# Patient Record
Sex: Male | Born: 2000 | Race: Black or African American | Hispanic: No | Marital: Single | Smoking: Never smoker
Health system: Southern US, Community
[De-identification: ages and names within clinical notes are randomized; demographics above are authoritative.]

---
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
## 2018-04-18 ENCOUNTER — Other Ambulatory Visit: Payer: Self-pay

## 2018-04-18 ENCOUNTER — Emergency Department (HOSPITAL_COMMUNITY)
Admission: EM | Admit: 2018-04-18 | Discharge: 2018-04-19 | Disposition: A | Discharge status: Home / Self Care | Payer: Self-pay | Attending: Emergency Medicine | Admitting: Emergency Medicine

## 2018-04-18 ENCOUNTER — Encounter (HOSPITAL_COMMUNITY): Payer: Self-pay | Admitting: Emergency Medicine

## 2018-04-18 DIAGNOSIS — Z79899 Other long term (current) drug therapy: Secondary | ICD-10-CM | POA: Insufficient documentation

## 2018-04-18 DIAGNOSIS — J4521 Mild intermittent asthma with (acute) exacerbation: Secondary | ICD-10-CM | POA: Insufficient documentation

## 2018-04-18 MED ORDER — ALBUTEROL SULFATE (2.5 MG/3ML) 0.083% IN NEBU
5.0000 mg | INHALATION_SOLUTION | Freq: Once | RESPIRATORY_TRACT | Status: AC
  Administered 2018-04-18: 5 mg via RESPIRATORY_TRACT
  Filled 2018-04-18: qty 6

## 2018-04-18 NOTE — ED Triage Notes (Signed)
C/o asthma flare-up since noon today.  Out of inhalers.

## 2018-04-19 ENCOUNTER — Emergency Department (HOSPITAL_COMMUNITY): Payer: Self-pay

## 2018-04-19 MED ORDER — ALBUTEROL SULFATE HFA 108 (90 BASE) MCG/ACT IN AERS: 1.0000 | INHALATION_SPRAY | Freq: Four times a day (QID) | RESPIRATORY_TRACT | 0 refills | Status: AC | PRN

## 2018-04-19 MED ORDER — ALBUTEROL SULFATE HFA 108 (90 BASE) MCG/ACT IN AERS
2.0000 | INHALATION_SPRAY | Freq: Once | RESPIRATORY_TRACT | Status: DC
  Filled 2018-04-19: qty 6.7

## 2018-04-19 NOTE — ED Provider Notes (Signed)
[facility] EMERGENCY [facility]PARTMENT Provider Note   CSN: [id number] Arrival date & time: 04/18/18  2330     History   Chief Complaint Chief Complaint  Patient presents with  . Asthma    HPI Randy Everett is a 17 y.o. male.  The history is provided by the patient and medical records.  Asthma  Associated symptoms include shortness of breath.    17 y.o. M with hx of asthma here with acute exacerbation.  Works in a [employer] around a lot of dust.  States he has been out of his inhaler for a few weeks.  No recent illness, fever, chills, sweats, chest pain.   VSS.  Past Medical History:  Diagnosis Date  . Asthma     There are no active problems to display for this patient.   History reviewed. No pertinent surgical history.      Home Medications    Prior to Admission medications   Medication Sig Start Date End Date Taking? Authorizing Provider  albuterol (PROVENTIL HFA;VENTOLIN HFA) 108 (90 Base) MCG/ACT inhaler Inhale 1-2 puffs into the lungs every 6 (six) hours as needed for wheezing. 04/19/18   Garlon HatchetSanders, Maisen Schmit M, PA-C  predniSONE (DELTASONE) 20 MG tablet 2 tabs po daily x 4 days 12/30/17   Felicie MornSmith, David, NP    Family History No family history on file.  Social History Social History   Tobacco Use  . Smoking status: Never Smoker  . Smokeless tobacco: Never Used  Substance Use Topics  . Alcohol use: No  . Drug use: No     Allergies   Patient has no known allergies.   Review of Systems Review of Systems  Respiratory: Positive for shortness of breath and wheezing.   All other systems reviewed and are negative.    Physical Exam Updated Vital Signs BP 129/82 (BP Location: Right Arm)   Pulse 86   Temp 97.8 F (36.6 C) (Oral)   Resp 20   SpO2 98%   Physical Exam  Constitutional: He is oriented to person, place, and time. He appears well-developed and well-nourished.  HENT:  Head: Normocephalic and atraumatic.  Mouth/Throat: Oropharynx  is clear and moist.  Eyes: Pupils are equal, round, and reactive to light. Conjunctivae and EOM are normal.  Neck: Normal range of motion.  Cardiovascular: Normal rate, regular rhythm and normal heart sounds.  Pulmonary/Chest: Effort normal. He has no decreased breath sounds. He has wheezes. He has no rhonchi.  Very faint expiratory wheeze, NAD, speaking in full sentences without difficulty  Abdominal: Soft. Bowel sounds are normal.  Musculoskeletal: Normal range of motion.  Neurological: He is alert and oriented to person, place, and time.  Skin: Skin is warm and dry.  Psychiatric: He has a normal mood and affect.  Nursing note and vitals reviewed.    ED Treatments / Results  Labs (all labs ordered are listed, but only abnormal results are displayed) Labs Reviewed - No data to display  EKG None  Radiology Dg Chest 2 View  Result Date: 04/19/2018 CLINICAL DATA:  17 year old male with shortness of breath and asthma. EXAM: CHEST - 2 VIEW COMPARISON:  Chest radiograph dated 12/30/2017 FINDINGS: There is hyperexpansion of the lungs in keeping with known asthma. No focal consolidation, pleural effusion, or pneumothorax. The cardiac silhouette is within normal limits. There is thoracic dextroscoliosis. No acute osseous pathology. IMPRESSION: No active cardiopulmonary disease. Electronically Signed   By: Elgie CollardArash  Radparvar M.D.   On: 04/19/2018 00:20  Procedures Procedures (including critical care time)  Medications Ordered in ED Medications  albuterol (PROVENTIL HFA;VENTOLIN HFA) 108 (90 Base) MCG/ACT inhaler 2 puff (has no administration in time range)  albuterol (PROVENTIL) (2.5 MG/3ML) 0.083% nebulizer solution 5 mg (5 mg Nebulization Given 04/18/18 2341)     Initial Impression / Assessment and Plan / ED Course  I have reviewed the triage vital signs and the nursing notes.  Pertinent labs & imaging results that were available during my care of the patient were reviewed by me and  considered in my medical decision making (see chart for details).  17 y.o. M with history of asthma, here with mild exacerbation.  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 Has been out of inhalers for several weeks.  Given neb treatment here which resolved symptoms.  Lungs mostly clear on my exam, NAD.  VSS.  Given inhaler here as well as prescription for refill.  Can follow-up with PCP.  Discussed plan with patient, he acknowledged understanding and agreed with plan of care.  Return precautions given for new or worsening symptoms.  Final Clinical Impressions(s) / ED Diagnoses   Final diagnoses:  Mild intermittent asthma with exacerbation    ED Discharge Orders         Ordered    albuterol (PROVENTIL HFA;VENTOLIN HFA) 108 (90 Base) MCG/ACT inhaler  Every 6 hours PRN     04/19/18 0033           Garlon Hatchet, PA-C 04/19/18 1610    Geoffery Lyons, MD 04/19/18 [phone]

## 2018-04-19 NOTE — ED Notes (Signed)
Patient demonstrated appropriate use of albuterol inhaler. Patient verbalizes understanding of discharge instructions. Opportunity for questioning and answers were provided. Armband removed by staff, pt discharged from ED, ambulatory to the lobby.

## 2018-04-19 NOTE — Discharge Instructions (Signed)
Use inhaler 2 puffs every 4-6 hours as needed for asthma attack. Follow-up with your primary care doctor. Return to the ED for new or worsening symptoms.

## 2018-05-20 ENCOUNTER — Encounter (HOSPITAL_COMMUNITY): Payer: Self-pay | Admitting: Emergency Medicine

## 2018-05-20 ENCOUNTER — Emergency Department (HOSPITAL_COMMUNITY)
Admission: EM | Admit: 2018-05-20 | Discharge: 2018-05-20 | Disposition: A | Discharge status: Home / Self Care | Payer: Managed Care, Other (non HMO) | Attending: Emergency Medicine | Admitting: Emergency Medicine

## 2018-05-20 ENCOUNTER — Other Ambulatory Visit: Payer: Self-pay

## 2018-05-20 DIAGNOSIS — Y929 Unspecified place or not applicable: Secondary | ICD-10-CM | POA: Diagnosis not present

## 2018-05-20 DIAGNOSIS — W268XXA Contact with other sharp object(s), not elsewhere classified, initial encounter: Secondary | ICD-10-CM | POA: Diagnosis not present

## 2018-05-20 DIAGNOSIS — Y939 Activity, unspecified: Secondary | ICD-10-CM | POA: Insufficient documentation

## 2018-05-20 DIAGNOSIS — Z79899 Other long term (current) drug therapy: Secondary | ICD-10-CM | POA: Insufficient documentation

## 2018-05-20 DIAGNOSIS — Z23 Encounter for immunization: Secondary | ICD-10-CM | POA: Diagnosis not present

## 2018-05-20 DIAGNOSIS — J45909 Unspecified asthma, uncomplicated: Secondary | ICD-10-CM | POA: Diagnosis not present

## 2018-05-20 DIAGNOSIS — Y999 Unspecified external cause status: Secondary | ICD-10-CM | POA: Insufficient documentation

## 2018-05-20 DIAGNOSIS — S51812A Laceration without foreign body of left forearm, initial encounter: Secondary | ICD-10-CM | POA: Diagnosis present

## 2018-05-20 DIAGNOSIS — S41112A Laceration without foreign body of left upper arm, initial encounter: Secondary | ICD-10-CM

## 2018-05-20 MED ORDER — LIDOCAINE HCL 2 % IJ SOLN
20.0000 mL | Freq: Once | INTRAMUSCULAR | Status: AC
  Administered 2018-05-20: 400 mg
  Filled 2018-05-20: qty 20

## 2018-05-20 MED ORDER — TETANUS-DIPHTH-ACELL PERTUSSIS 5-2.5-18.5 LF-MCG/0.5 IM SUSP
0.5000 mL | Freq: Once | INTRAMUSCULAR | Status: AC
  Administered 2018-05-20: 0.5 mL via INTRAMUSCULAR
  Filled 2018-05-20: qty 0.5

## 2018-05-20 NOTE — Discharge Instructions (Addendum)
Please read attached information, please return in 7 to 10 days for suture removal, return sooner if any new or worsening signs or symptoms present occluding redness, swelling, warmth or discharge.

## 2018-05-20 NOTE — ED Triage Notes (Signed)
Patient presents with laceration to anterior left forearm. States he cut it on a metal trashcan. Needs tetanus shot. Bleeding controlled.

## 2018-05-20 NOTE — ED Notes (Signed)
Patient verbalizes understanding of discharge instructions. Opportunity for questioning and answers were provided. Armband removed by staff, pt discharged from ED ambulatory.   

## 2018-05-20 NOTE — ED Notes (Signed)
ED Provider at bedside. 

## 2018-05-20 NOTE — ED Provider Notes (Signed)
[facility] EMERGENCY DEPARTMENT Provider Note   CSN: [id number] Arrival date & time: 05/20/18  1105    History   Chief Complaint Chief Complaint  Patient presents with  . Laceration    HPI Randy Everett is a 17 y.o. male.  HPI   17 year old male presents today with laceration to his left forearm.  Patient notes shortly prior to arrival to the emergency room he cut his forearm on a trash can.  He notes bleeding controlled, denies any loss of distal sensation strength and motor function.  Uncertain of his last tetanus shot.   Past Medical History:  Diagnosis Date  . Asthma     There are no active problems to display for this patient.   History reviewed. No pertinent surgical history.      Home Medications    Prior to Admission medications   Medication Sig Start Date End Date Taking? Authorizing Provider  albuterol (PROVENTIL HFA;VENTOLIN HFA) 108 (90 Base) MCG/ACT inhaler Inhale 1-2 puffs into the lungs every 6 (six) hours as needed for wheezing. 04/19/18   Garlon Hatchet, PA-C  predniSONE (DELTASONE) 20 MG tablet 2 tabs po daily x 4 days 12/30/17   Felicie Morn, NP    Family History No family history on file.  Social History Social History   Tobacco Use  . Smoking status: Never Smoker  . Smokeless tobacco: Never Used  Substance Use Topics  . Alcohol use: No  . Drug use: No     Allergies   Patient has no known allergies.   Review of Systems Review of Systems  All other systems reviewed and are negative.   Physical Exam Updated Vital Signs BP 116/75   Pulse 65   Temp 98.6 F (37 C) (Oral)   Resp 14   Ht 6' (1.829 m)   Wt 68 kg   SpO2 100%   BMI 20.34 kg/m   Physical Exam  Constitutional: He is oriented to person, place, and time. He appears well-developed and well-nourished.  HENT:  Head: Normocephalic and atraumatic.  Eyes: Pupils are equal, round, and reactive to light. Conjunctivae are normal. Right eye exhibits no  discharge. Left eye exhibits no discharge. No scleral icterus.  Neck: Normal range of motion. No JVD present. No tracheal deviation present.  Pulmonary/Chest: Effort normal. No stridor.  Musculoskeletal:  4 cm laceration to left forearm- no deep space involvement-distal sensation strength motor function intact  Neurological: He is alert and oriented to person, place, and time. Coordination normal.  Psychiatric: He has a normal mood and affect. His behavior is normal. Judgment and thought content normal.  Nursing note and vitals reviewed.    ED Treatments / Results  Labs (all labs ordered are listed, but only abnormal results are displayed) Labs Reviewed - No data to display  EKG None  Radiology No results found.  Procedures .Marland KitchenLaceration Repair Date/Time: 05/20/2018 2:59 PM Performed by: Eyvonne Mechanic, PA-C Authorized by: Eyvonne Mechanic, PA-C   Consent:    Consent obtained:  Verbal   Consent given by:  Patient   Risks discussed:  Infection, need for additional repair, nerve damage, pain, poor cosmetic result, poor wound healing, tendon damage and vascular damage   Alternatives discussed:  No treatment and delayed treatment Anesthesia (see MAR for exact dosages):    Anesthesia method:  Local infiltration   Local anesthetic:  Lidocaine 2% w/o epi Laceration details:    Location: left forearm    Length (cm):  4 Repair type:  Repair type:  Simple Pre-procedure details:    Preparation:  Patient was prepped and draped in usual sterile fashion Exploration:    Wound exploration: wound explored through full range of motion and entire depth of wound probed and visualized     Wound extent: no fascia violation noted, no foreign bodies/material noted, no muscle damage noted, no nerve damage noted, no tendon damage noted, no underlying fracture noted and no vascular damage noted     Contaminated: no   Treatment:    Area cleansed with:  Saline   Amount of cleaning:  Standard    Irrigation solution:  Sterile saline   Visualized foreign bodies/material removed: no   Skin repair:    Repair method:  Sutures   Suture size:  3-0   Suture material:  Prolene   Number of sutures:  7 Approximation:    Approximation:  Close Post-procedure details:    Dressing:  Antibiotic ointment and non-adherent dressing   Patient tolerance of procedure:  Tolerated well, no immediate complications Comments:     Suture repaired with the assistance of PA student Bebe Liter   (including critical care time)  Medications Ordered in ED Medications  lidocaine (XYLOCAINE) 2 % (with pres) injection 400 mg (400 mg Infiltration Given by Other 05/20/18 1356)  Tdap (BOOSTRIX) injection 0.5 mL (0.5 mLs Intramuscular Given 05/20/18 1224)     Initial Impression / Assessment and Plan / ED Course  I have reviewed the triage vital signs and the nursing notes.  Pertinent labs & imaging results that were available during my care of the patient were reviewed by me and considered in my medical decision making (see chart for details).     Labs:   Imaging:  Consults:  Therapeutics:  Discharge Meds:   Assessment/Plan: 17 year old male presents today with laceration to his left forearm, no deep space involvement, wound uncomplicated.  Return precautions given for suture removal or worsening signs or symptoms.  Patient verbalized understanding and agreement to today's plan.  Final Clinical Impressions(s) / ED Diagnoses   Final diagnoses:  Laceration of left upper extremity, initial encounter    ED Discharge Orders    None       Eyvonne Mechanic, PA-C 05/20/18 1501    Virgina Norfolk, DO 05/20/18 2336

## 2018-05-28 ENCOUNTER — Emergency Department (HOSPITAL_COMMUNITY)
Admission: EM | Admit: 2018-05-28 | Discharge: 2018-05-28 | Disposition: A | Discharge status: Home / Self Care | Payer: Managed Care, Other (non HMO) | Attending: Emergency Medicine | Admitting: Emergency Medicine

## 2018-05-28 ENCOUNTER — Encounter (HOSPITAL_COMMUNITY): Payer: Self-pay | Admitting: *Deleted

## 2018-05-28 DIAGNOSIS — J45909 Unspecified asthma, uncomplicated: Secondary | ICD-10-CM | POA: Diagnosis not present

## 2018-05-28 DIAGNOSIS — R222 Localized swelling, mass and lump, trunk: Secondary | ICD-10-CM | POA: Diagnosis present

## 2018-05-28 DIAGNOSIS — L0291 Cutaneous abscess, unspecified: Secondary | ICD-10-CM

## 2018-05-28 DIAGNOSIS — L0231 Cutaneous abscess of buttock: Secondary | ICD-10-CM | POA: Insufficient documentation

## 2018-05-28 MED ORDER — LIDOCAINE HCL 2 % IJ SOLN
20.0000 mL | Freq: Once | INTRAMUSCULAR | Status: AC
  Administered 2018-05-28: 400 mg
  Filled 2018-05-28: qty 20

## 2018-05-28 MED ORDER — SULFAMETHOXAZOLE-TRIMETHOPRIM 800-160 MG PO TABS: 2.0000 | ORAL_TABLET | Freq: Two times a day (BID) | ORAL | 0 refills | Status: AC

## 2018-05-28 NOTE — ED Notes (Signed)
Declined W/C at D/C and was escorted to lobby by RN. 

## 2018-05-28 NOTE — Discharge Instructions (Signed)
Return here in 2 days for packing removal.  Use warm compresses and heat around the area.  Keep the area around clean and dry.

## 2018-05-28 NOTE — ED Provider Notes (Signed)
[facility] EMERGENCY DEPARTMENT Provider Note   CSN: [id number] Arrival date & time: 05/28/18  4098     History   Chief Complaint Chief Complaint  Patient presents with  . Abscess    HPI Randy Everett is a 17 y.o. male.  HPI Patient presents to the emergency department with an abscess to the left lower buttocks upper thigh region serially.  The patient states that this is been ongoing over the last 4 days.  He states the pain increased last night.  He states there is no drainage from the area.  Patient states he has not taken any medications prior to arrival for symptoms.  Patient denies fever, nausea, vomiting, rectal bleeding, rectal pain, penile discharge, weakness, dizziness, headache, blurred vision or syncope. Past Medical History:  Diagnosis Date  . Asthma     There are no active problems to display for this patient.   History reviewed. No pertinent surgical history.      Home Medications    Prior to Admission medications   Medication Sig Start Date End Date Taking? Authorizing Provider  albuterol (PROVENTIL HFA;VENTOLIN HFA) 108 (90 Base) MCG/ACT inhaler Inhale 1-2 puffs into the lungs every 6 (six) hours as needed for wheezing. 04/19/18  Yes Garlon Hatchet, PA-C  predniSONE (DELTASONE) 20 MG tablet 2 tabs po daily x 4 days Patient not taking: Reported on 05/28/2018 12/30/17   Felicie Morn, NP    Family History History reviewed. No pertinent family history.  Social History Social History   Tobacco Use  . Smoking status: Never Smoker  . Smokeless tobacco: Never Used  Substance Use Topics  . Alcohol use: No  . Drug use: No     Allergies   Patient has no known allergies.   Review of Systems Review of Systems  All other systems negative except as documented in the HPI. All pertinent positives and negatives as reviewed in the HPI. Physical Exam Updated Vital Signs BP 124/68 (BP Location: Right Arm)   Pulse 81   Temp 98.2 F  (36.8 C) (Oral)   Resp 17   SpO2 98%   Physical Exam  Constitutional: He is oriented to person, place, and time. He appears well-developed and well-nourished. No distress.  HENT:  Head: Normocephalic and atraumatic.  Eyes: Pupils are equal, round, and reactive to light.  Pulmonary/Chest: Effort normal.  Neurological: He is alert and oriented to person, place, and time.  Skin: Skin is warm and dry.  Psychiatric: He has a normal mood and affect.  Nursing note and vitals reviewed.    ED Treatments / Results  Labs (all labs ordered are listed, but only abnormal results are displayed) Labs Reviewed - No data to display  EKG None  Radiology No results found.  Procedures Procedures (including critical care time)  Medications Ordered in ED Medications  lidocaine (XYLOCAINE) 2 % (with pres) injection 400 mg (400 mg Infiltration Given 05/28/18 0902)     Initial Impression / Assessment and Plan / ED Course  I have reviewed the triage vital signs and the nursing notes.  Pertinent labs & imaging results that were available during my care of the patient were reviewed by me and considered in my medical decision making (see chart for details).    INCISION AND DRAINAGE Performed by: Jamesetta Orleans Breindel Collier Consent: Verbal consent obtained. Risks and benefits: risks, benefits and alternatives were discussed Type: abscess  Body area: Lower buttocks upper posterior thigh   Anesthesia: local infiltration  Incision  was made with a scalpel.  Local anesthetic: lidocaine 2% wo epinephrine  Anesthetic total: 5 ml  Complexity: complex Blunt dissection to break up loculations  Drainage: purulent  Drainage amount: large  Packing material: 1/4 in iodoform gauze  Patient tolerance: Patient tolerated the procedure well with no immediate complications.  I advised the patient to return here as needed.  Have advised him to return here in 2 days for recheck and packing removal.  The  patient agrees the plan and all questions were answered.  I did pack the wound because the fact that it was in the perineal/anal/upper thigh region I was concerned for any further worsening.  Will place on antibiotics as well.   Final Clinical Impressions(s) / ED Diagnoses   Final diagnoses:  None    ED Discharge Orders    None       Charlestine Night, PA-C 05/28/18 1023    Arby Barrette, MD 05/29/18 [phone]

## 2018-05-28 NOTE — ED Triage Notes (Signed)
Pt in c/o abscess to his posterior left thigh x4 days, reports increased pain today, no drainage

## 2018-05-29 ENCOUNTER — Emergency Department (HOSPITAL_COMMUNITY)
Admission: EM | Admit: 2018-05-29 | Discharge: 2018-05-29 | Disposition: A | Discharge status: Home / Self Care | Payer: Managed Care, Other (non HMO) | Attending: Emergency Medicine | Admitting: Emergency Medicine

## 2018-05-29 ENCOUNTER — Encounter (HOSPITAL_COMMUNITY): Payer: Self-pay

## 2018-05-29 DIAGNOSIS — Z0289 Encounter for other administrative examinations: Secondary | ICD-10-CM

## 2018-05-29 DIAGNOSIS — Z0279 Encounter for issue of other medical certificate: Secondary | ICD-10-CM | POA: Insufficient documentation

## 2018-05-29 DIAGNOSIS — J45909 Unspecified asthma, uncomplicated: Secondary | ICD-10-CM | POA: Diagnosis not present

## 2018-05-29 DIAGNOSIS — Z79899 Other long term (current) drug therapy: Secondary | ICD-10-CM | POA: Insufficient documentation

## 2018-05-29 NOTE — ED Triage Notes (Signed)
Pt presents for work note. Pt was seen yesterday with abscess I&D to leg. States needs note with restrictions for work because job is very labor intensive.

## 2018-05-29 NOTE — ED Notes (Signed)
Pt reports that his abscess was drained here yesterday. Pt works in a [employer] where he is moving around. The abscess was drained in a very uncomfortable area that decreases mobility. States he needs a few days off of work to heal. Provider notified.

## 2018-05-29 NOTE — ED Notes (Signed)
ED Provider at bedside. 

## 2018-05-29 NOTE — ED Provider Notes (Signed)
[facility] EMERGENCY DEPARTMENT Provider Note   CSN: [id number] Arrival date & time: 05/29/18  1127     History   Chief Complaint Chief Complaint  Patient presents with  . Discharge Note    HPI Randy Everett is a 17 y.o. male with a hx of asthma who presents to the ED requesting work note. Patient was seen in the ED yesterday and was found to have a left sided perianal/upper thigh region abscess- underwent incision and drainage with packing placement and was discharged home on bactrim with instructions for wound recheck and packing removal in 2 days. Patient states he has been doing well since the procedure, the area is somewhat uncomfortable, and does continue to drain. No specific alleviating/aggravating factors. Taking abx as prescribed. Here today as he was not given a work note yesterday and does strenuous activity at his job which is very uncomfortable, no other major complaints. Denies fever, chills, or abdominal pain.   HPI  Past Medical History:  Diagnosis Date  . Asthma     There are no active problems to display for this patient.   History reviewed. No pertinent surgical history.      Home Medications    Prior to Admission medications   Medication Sig Start Date End Date Taking? Authorizing Provider  albuterol (PROVENTIL HFA;VENTOLIN HFA) 108 (90 Base) MCG/ACT inhaler Inhale 1-2 puffs into the lungs every 6 (six) hours as needed for wheezing. 04/19/18   Garlon Hatchet, PA-C  predniSONE (DELTASONE) 20 MG tablet 2 tabs po daily x 4 days Patient not taking: Reported on 05/28/2018 12/30/17   Felicie Morn, NP  sulfamethoxazole-trimethoprim (BACTRIM DS,SEPTRA DS) 800-160 MG tablet Take 2 tablets by mouth 2 (two) times daily for 7 days. 05/28/18 06/04/18  Charlestine Night, PA-C    Family History No family history on file.  Social History Social History   Tobacco Use  . Smoking status: Never Smoker  . Smokeless tobacco: Never Used  Substance  Use Topics  . Alcohol use: No  . Drug use: No     Allergies   Patient has no known allergies.   Review of Systems Review of Systems  Constitutional: Negative for chills and fever.  Gastrointestinal: Negative for abdominal pain, nausea and vomiting.  Skin: Positive for wound.     Physical Exam Updated Vital Signs BP 139/74 (BP Location: Right Arm)   Pulse 76   Temp 98.3 F (36.8 C) (Oral)   Resp 16   SpO2 98%   Physical Exam  Constitutional: He appears well-developed and well-nourished. No distress.  HENT:  Head: Normocephalic and atraumatic.  Eyes: Conjunctivae are normal. Right eye exhibits no discharge. Left eye exhibits no discharge.  Abdominal: Soft. He exhibits no distension. There is no tenderness.  Genitourinary:     Neurological: He is alert.  Clear speech.   Psychiatric: He has a normal mood and affect. His behavior is normal. Thought content normal.  Nursing note and vitals reviewed.    ED Treatments / Results  Labs (all labs ordered are listed, but only abnormal results are displayed) Labs Reviewed - No data to display  EKG None  Radiology No results found.  Procedures Procedures (including critical care time)  Medications Ordered in ED Medications - No data to display   Initial Impression / Assessment and Plan / ED Course  I have reviewed the triage vital signs and the nursing notes.  Pertinent labs & imaging results that were available during my care of  the patient were reviewed by me and considered in my medical decision making (see chart for details).   Patient returns to the emergency department requesting work note following incision and drainage with packing placement of a perianal/upper thigh abscess yesterday.  He is scheduled for wound recheck and packing removal tomorrow and is currently on Bactrim.  He states that he has been doing well since the procedure.  On exam packing in place, area appears to be doing well.  He states  that his job requires a lot of activity and movement which really aggravates the pain to the prior abscess area which is what prompted visit today-work note was provided.  Continue Bactrim as prescribed with plan for recheck and packing removal tomorrow. I discussed treatment plan, need for follow-up, and return precautions with the patient. Provided opportunity for questions, patient confirmed understanding and is in agreement with plan.   Final Clinical Impressions(s) / ED Diagnoses   Final diagnoses:  Encounter to obtain excuse from work    ED Discharge Orders    None       Cherly Anderson, [place] 05/29/18 1202    Arby Barrette, MD 05/29/18 [phone]

## 2018-05-29 NOTE — Discharge Instructions (Signed)
Work note was provided.  Please be sure to return to the ER, go to urgent care, or see your primary care provider tomorrow for a wound recheck and for removal of the packing.  Return to the ER sooner for new or worsening symptoms or any other concerns.

## 2019-06-07 ENCOUNTER — Emergency Department (HOSPITAL_COMMUNITY)
Admission: EM | Admit: 2019-06-07 | Discharge: 2019-06-07 | Disposition: A | Discharge status: Home / Self Care | Payer: Managed Care, Other (non HMO) | Attending: Emergency Medicine | Admitting: Emergency Medicine

## 2019-06-07 DIAGNOSIS — H1033 Unspecified acute conjunctivitis, bilateral: Secondary | ICD-10-CM

## 2019-06-07 DIAGNOSIS — Z79899 Other long term (current) drug therapy: Secondary | ICD-10-CM | POA: Insufficient documentation

## 2019-06-07 DIAGNOSIS — J45909 Unspecified asthma, uncomplicated: Secondary | ICD-10-CM | POA: Insufficient documentation

## 2019-06-07 MED ORDER — GENTAMICIN SULFATE 0.3 % OP SOLN: 2.0000 [drp] | OPHTHALMIC | 0 refills | Status: AC

## 2019-06-07 NOTE — Discharge Instructions (Addendum)
Apply antibiotic eyedrops every 4 hours as prescribed.  Continue to apply drops to both eyes until symptoms have completely resolved for 3 days. Use preservative-free eyedrops as needed. Take Zyrtec daily. If eye redness and itching persist, continue with Zyrtec and use Flonase.

## 2019-06-07 NOTE — ED Provider Notes (Signed)
[facility] EMERGENCY DEPARTMENT Provider Note   CSN: [id number] Arrival date & time: 06/07/19  4098     History   Chief Complaint No chief complaint on file.   HPI Randy Everett is a 18 y.o. male.     18 year old male presents with complaint of bilateral eye redness and itching for the past week.  Patient states his eyes are matted shut in the morning when he first wakes up otherwise no drainage throughout the day.  Patient has been applying OTC pinkeye drops without any improvement in his symptoms.  Denies cough, congestion, sneezing, runny nose, sore throat, body aches or changes in bowel or bladder habits.  No known contact with anyone with Covid symptoms.  No other complaints or concerns.  Randy Everett was evaluated in Emergency Department on 06/07/2019 for the symptoms described in the history of present illness. He was evaluated in the context of the global COVID-19 pandemic, which necessitated consideration that the patient might be at risk for infection with the SARS-CoV-2 virus that causes COVID-19. Institutional protocols and algorithms that pertain to the evaluation of patients at risk for COVID-19 are in a state of rapid change based on information released by regulatory bodies including the CDC and federal and state organizations. These policies and algorithms were followed during the patient's care in the ED.      Past Medical History:  Diagnosis Date  . Asthma     There are no active problems to display for this patient.   No past surgical history on file.      Home Medications    Prior to Admission medications   Medication Sig Start Date End Date Taking? Authorizing Provider  albuterol (PROVENTIL HFA;VENTOLIN HFA) 108 (90 Base) MCG/ACT inhaler Inhale 1-2 puffs into the lungs every 6 (six) hours as needed for wheezing. 04/19/18   Larene Pickett, PA-C  gentamicin (GARAMYCIN) 0.3 % ophthalmic solution Place 2 drops into both eyes every 4  (four) hours. 06/07/19   Tacy Learn, PA-C    Family History No family history on file.  Social History Social History   Tobacco Use  . Smoking status: Never Smoker  . Smokeless tobacco: Never Used  Substance Use Topics  . Alcohol use: No  . Drug use: No     Allergies   Patient has no known allergies.   Review of Systems Review of Systems  Constitutional: Negative for chills and fever.  HENT: Negative for congestion, postnasal drip, rhinorrhea, sinus pressure, sinus pain, sneezing and sore throat.   Eyes: Positive for discharge, redness and itching. Negative for photophobia, pain and visual disturbance.  Respiratory: Negative for cough.   Gastrointestinal: Negative for diarrhea, nausea and vomiting.  Musculoskeletal: Negative for arthralgias and myalgias.  Skin: Negative for color change, rash and wound.  Allergic/Immunologic: Negative for immunocompromised state.  Neurological: Negative for headaches.  Hematological: Negative for adenopathy.  All other systems reviewed and are negative.    Physical Exam Updated Vital Signs BP 116/72 (BP Location: Right Arm)   Pulse 84   Temp 98.3 F (36.8 C) (Oral)   Resp 18   SpO2 96%   Physical Exam Vitals signs and nursing note reviewed.  Constitutional:      General: He is not in acute distress.    Appearance: He is well-developed. He is not diaphoretic.  HENT:     Head: Normocephalic and atraumatic.     Right Ear: Tympanic membrane and ear canal normal.  Left Ear: Tympanic membrane and ear canal normal.     Nose: Nose normal. No congestion.     Mouth/Throat:     Mouth: Mucous membranes are moist.  Eyes:     General:        Right eye: No discharge.        Left eye: No discharge.     Extraocular Movements: Extraocular movements intact.     Conjunctiva/sclera:     Right eye: Right conjunctiva is injected.     Left eye: Left conjunctiva is injected.     Pupils: Pupils are equal, round, and reactive to light.      Comments: Injected conjunctiva, no drainage, no perilimbal injection  Neck:     Musculoskeletal: Neck supple. No muscular tenderness.  Pulmonary:     Effort: Pulmonary effort is normal.  Lymphadenopathy:     Cervical: No cervical adenopathy.  Skin:    General: Skin is warm and dry.     Findings: No erythema or rash.  Neurological:     Mental Status: He is alert and oriented to person, place, and time.  Psychiatric:        Behavior: Behavior normal.      ED Treatments / Results  Labs (all labs ordered are listed, but only abnormal results are displayed) Labs Reviewed - No data to display  EKG None  Radiology No results found.  Procedures Procedures (including critical care time)  Medications Ordered in ED Medications - No data to display   Initial Impression / Assessment and Plan / ED Course  I have reviewed the triage vital signs and the nursing notes.  Pertinent labs & imaging results that were available during my care of the patient were reviewed by me and considered in my medical decision making (see chart for details).  Clinical Course as of Jun 06 1013  Sun Jun 07, 2019  1011 18yo male with bilateral conjunctivitis x 1 week, not improving with OCT pink eye drops. On exam, bilateral eye injection, no drainage present at this time although reports eyes matted shut upon waking. Discussed viral vs allergic vs bacterial conjunctivitis. Patient given rx for gentamycin drops, recommend also preservative free otc drops, zyrtec, can add flonase if symptoms are not improving with gentamycin.    [LM]    Clinical Course User Index [LM] Jeannie Fend, PA-C      Final Clinical Impressions(s) / ED Diagnoses   Final diagnoses:  Acute conjunctivitis of both eyes, unspecified acute conjunctivitis type    ED Discharge Orders         Ordered    gentamicin (GARAMYCIN) 0.3 % ophthalmic solution  Every 4 hours     06/07/19 0958           Jeannie Fend, PA-C  06/07/19 1014    Jacalyn Lefevre, MD 06/07/19 1052

## 2019-06-07 NOTE — ED Triage Notes (Signed)
Patient having conjunctivitis to bilateral eyes.

## 2021-06-12 ENCOUNTER — Emergency Department (HOSPITAL_COMMUNITY)
Admission: EM | Admit: 2021-06-12 | Discharge: 2021-06-12 | Disposition: A | Discharge status: Home / Self Care | Payer: Self-pay | Attending: Emergency Medicine | Admitting: Emergency Medicine

## 2021-06-12 ENCOUNTER — Encounter (HOSPITAL_COMMUNITY): Payer: Self-pay

## 2021-06-12 ENCOUNTER — Other Ambulatory Visit: Payer: Self-pay

## 2021-06-12 ENCOUNTER — Emergency Department (HOSPITAL_COMMUNITY): Payer: Self-pay

## 2021-06-12 DIAGNOSIS — Y92410 Unspecified street and highway as the place of occurrence of the external cause: Secondary | ICD-10-CM | POA: Diagnosis not present

## 2021-06-12 DIAGNOSIS — S060XAA Concussion with loss of consciousness status unknown, initial encounter: Secondary | ICD-10-CM

## 2021-06-12 DIAGNOSIS — R519 Headache, unspecified: Secondary | ICD-10-CM

## 2021-06-12 DIAGNOSIS — J45909 Unspecified asthma, uncomplicated: Secondary | ICD-10-CM | POA: Insufficient documentation

## 2021-06-12 DIAGNOSIS — S060X0A Concussion without loss of consciousness, initial encounter: Secondary | ICD-10-CM | POA: Insufficient documentation
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
